# Patient Record
Sex: Male | Born: 1995
Health system: Southern US, Community
[De-identification: ages and names within clinical notes are randomized; demographics above are authoritative.]

## PROBLEM LIST (undated history)

## (undated) DIAGNOSIS — G43909 Migraine, unspecified, not intractable, without status migrainosus: Secondary | ICD-10-CM

## (undated) HISTORY — PX: CLOSED REDUCTION HAND FRACTURE: SHX973

## (undated) HISTORY — DX: Migraine, unspecified, not intractable, without status migrainosus: G43.909

---
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
## 2015-09-17 DIAGNOSIS — J029 Acute pharyngitis, unspecified: Secondary | ICD-10-CM | POA: Diagnosis not present

## 2016-01-02 DIAGNOSIS — M222X2 Patellofemoral disorders, left knee: Secondary | ICD-10-CM | POA: Diagnosis not present

## 2016-01-15 DIAGNOSIS — M25562 Pain in left knee: Secondary | ICD-10-CM | POA: Diagnosis not present

## 2016-01-16 DIAGNOSIS — M25562 Pain in left knee: Secondary | ICD-10-CM | POA: Diagnosis not present

## 2016-01-16 DIAGNOSIS — M7652 Patellar tendinitis, left knee: Secondary | ICD-10-CM | POA: Diagnosis not present

## 2016-01-22 DIAGNOSIS — M25562 Pain in left knee: Secondary | ICD-10-CM | POA: Diagnosis not present

## 2016-01-22 DIAGNOSIS — M7652 Patellar tendinitis, left knee: Secondary | ICD-10-CM | POA: Diagnosis not present

## 2016-01-24 DIAGNOSIS — M25562 Pain in left knee: Secondary | ICD-10-CM | POA: Diagnosis not present

## 2016-01-24 DIAGNOSIS — M7652 Patellar tendinitis, left knee: Secondary | ICD-10-CM | POA: Diagnosis not present

## 2016-01-30 DIAGNOSIS — M25562 Pain in left knee: Secondary | ICD-10-CM | POA: Diagnosis not present

## 2016-01-30 DIAGNOSIS — M7652 Patellar tendinitis, left knee: Secondary | ICD-10-CM | POA: Diagnosis not present

## 2016-02-05 DIAGNOSIS — M25562 Pain in left knee: Secondary | ICD-10-CM | POA: Diagnosis not present

## 2016-02-05 DIAGNOSIS — M7652 Patellar tendinitis, left knee: Secondary | ICD-10-CM | POA: Diagnosis not present

## 2016-02-14 DIAGNOSIS — M7652 Patellar tendinitis, left knee: Secondary | ICD-10-CM | POA: Diagnosis not present

## 2016-02-19 DIAGNOSIS — M7652 Patellar tendinitis, left knee: Secondary | ICD-10-CM | POA: Diagnosis not present

## 2016-02-19 DIAGNOSIS — M25562 Pain in left knee: Secondary | ICD-10-CM | POA: Diagnosis not present

## 2016-06-26 DIAGNOSIS — R202 Paresthesia of skin: Secondary | ICD-10-CM | POA: Diagnosis not present

## 2016-07-01 DIAGNOSIS — R202 Paresthesia of skin: Secondary | ICD-10-CM | POA: Diagnosis not present

## 2016-07-29 DIAGNOSIS — R21 Rash and other nonspecific skin eruption: Secondary | ICD-10-CM | POA: Diagnosis not present

## 2016-07-29 DIAGNOSIS — J309 Allergic rhinitis, unspecified: Secondary | ICD-10-CM | POA: Diagnosis not present

## 2016-07-29 DIAGNOSIS — R202 Paresthesia of skin: Secondary | ICD-10-CM | POA: Diagnosis not present

## 2016-07-29 DIAGNOSIS — H1045 Other chronic allergic conjunctivitis: Secondary | ICD-10-CM | POA: Diagnosis not present

## 2016-08-19 DIAGNOSIS — R202 Paresthesia of skin: Secondary | ICD-10-CM | POA: Diagnosis not present

## 2016-09-10 ENCOUNTER — Encounter: Payer: Self-pay | Admitting: Neurology

## 2016-09-10 ENCOUNTER — Ambulatory Visit (INDEPENDENT_AMBULATORY_CARE_PROVIDER_SITE_OTHER): Payer: BLUE CROSS/BLUE SHIELD | Admitting: Neurology

## 2016-09-10 ENCOUNTER — Encounter (INDEPENDENT_AMBULATORY_CARE_PROVIDER_SITE_OTHER): Payer: Self-pay

## 2016-09-10 VITALS — BP 115/72 | HR 64 | Resp 12 | Wt 173.0 lb

## 2016-09-10 DIAGNOSIS — G35 Multiple sclerosis: Secondary | ICD-10-CM | POA: Diagnosis not present

## 2016-09-10 DIAGNOSIS — R202 Paresthesia of skin: Secondary | ICD-10-CM

## 2016-09-10 NOTE — Patient Instructions (Signed)
Remember to drink plenty of fluid, eat healthy meals and do not skip any meals. Try to eat protein with a every meal and eat a healthy snack such as fruit or nuts in between meals. Try to keep a regular sleep-wake schedule and try to exercise daily, particularly in the form of walking, 20-30 minutes a day, if you can.   As far as diagnostic testing: Labs, MRI brain and cervical spine  Our phone number is [phone]. We also have an after hours call service for urgent matters and there is a physician on-call for urgent questions. For any emergencies you know to call 911 or go to the nearest emergency room

## 2016-09-10 NOTE — Progress Notes (Addendum)
[facility]    Provider:  Dr Lucia Gaskins Referring Provider: Iva Boop, MD Primary Care Physician:  Iva Boop, MD  CC:  Needles stabbing all over the body  HPI:  Travis Middleton is a 21 y.o. male here as a referral from Dr. Via for persistent tingling sensation of skin.. No significant past medical history except migraines. It only happens when he gets hot. Started 4 months ago usually when he gets hot, hot water, in the sun. This occurs all over the body except the hands and feet. Can last a few seconds or until he cools off. It slowly starts and slowly resolves. Hasn't noticed it at night. It does not start in a specific spot. He was at work when he notice, no inciting events, no recent illnesses, no trauma, nothing that can be associated with the onset. The symptoms are painful. He has to stop working to let himself cool down. No vision changes, no weakness, no speech changes or any other associated symptoms. It can come and go all day if he is at work and it is hot. Symptoms are symmetric. Can be an 8/10 in pain, nothing makes it better except cooling down, it is everywhere including face, groin, back, abdomen, limbs, all over the body. No hx of any neurologic issues.rarely he can feel it when he is not hot. No other focal neurologic deficits, associated symptoms, inciting events or modifiable factors.No Fhx of neuropathy or autoimmune disorders. Not progressive. Not exposed to toxins or chemicals.   Reviewed notes, labs and imaging from outside physicians, which showed:   21 year old male presenting for follow-up for persistent tingling sensation over the entire body. Continues to happen when he gets hot. However also happening other times now. Patient's allergy last month. Allergy testing was negative. They recommended continuing loratadine and Singulair. We also added hydroxyzine. Hydroxyzine does not help. In only happens when he gets hot. Claritin has not helped. Symptoms seem to  be worsening. No rash. 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 Reviewed primary care's exam which included normal general appearance, skin, head, eyes, ears, nose, throat, lungs, heart, musculoskeletal, neurologic and site. He was told to continue loratadine tablet once a day and montelukast. Labs ordered such as CBC, CMP, sedimentation rate, TSH, CRP. More consistent with histamine release when he over heats.   Reviewed labs. CRP was normal, TSH was normal, sedimentation rate was normal, CMP was unremarkable with BUN 13 and creatinine 1. CBC was normal.  Review of Systems: Patient complains of symptoms per HPI as well as the following symptoms: Tingling. No rash, no chest pain, no shortness of breath, no fever. Pertinent negatives per HPI. All others negative.   Social History   Social History  . Marital status: Single    Spouse name: N/A  . Number of children: 0  . Years of education: 12   Occupational History  . [employer]    Social History Main Topics  . Smoking status: Never Smoker  . Smokeless tobacco: Never Used  . Alcohol use 2.4 oz/week    4 Standard drinks or equivalent per week  . Drug use: No  . Sexual activity: Not on file   Other Topics Concern  . Not on file   Social History Narrative   Lives at home w/ his mom   Right-handed   Caffeine: occasional soda    Family History  Problem Relation Age of Onset  .  High blood pressure Father   . Cancer Maternal Grandmother   . Other Other        Cancer and heart failure in great-grandparents  . Multiple sclerosis Neg Hx     Past Medical History:  Diagnosis Date  . Migraine     Past Surgical History:  Procedure Laterality Date  . CLOSED REDUCTION HAND FRACTURE Right     Current Outpatient Prescriptions  Medication Sig Dispense Refill  . aspirin-acetaminophen-caffeine (EXCEDRIN MIGRAINE) 250-250-65 MG tablet Take by mouth every 6 (six) hours as needed  for headache.    . loratadine (CLARITIN) 10 MG tablet Take 10 mg by mouth daily.    . montelukast (SINGULAIR) 10 MG tablet      No current facility-administered medications for this visit.     Allergies as of 09/10/2016 - Review Complete 09/10/2016  Allergen Reaction Noted  . Azithromycin  09/10/2016    Vitals: BP 115/72   Pulse 64   Resp 12   Wt 173 lb (78.5 kg)  height 5'7" Last Weight:  Wt Readings from Last 1 Encounters:  09/10/16 173 lb (78.5 kg)   Last Height:   Ht Readings from Last 1 Encounters:  No data found for Ht    Physical exam: Exam: Gen: NAD, conversant        CV: RRR, no MRG. No Carotid Bruits. No peripheral edema, warm, nontender Eyes: Conjunctivae clear without exudates or hemorrhage  Neuro: Detailed Neurologic Exam  Speech:    Speech is normal; fluent and spontaneous with normal comprehension.  Cognition:    The patient is oriented to person, place, and time;     recent and remote memory intact;     language fluent;     normal attention, concentration,     fund of knowledge Cranial Nerves:    The pupils are equal, round, and reactive to light. The fundi are normal and spontaneous venous pulsations are present. Visual fields are full to finger confrontation. Extraocular movements are intact. Trigeminal sensation is intact and the muscles of mastication are normal. The face is symmetric. The palate elevates in the midline. Hearing intact. Voice is normal. Shoulder shrug is normal. The tongue has normal motion without fasciculations.   Coordination:    Normal finger to nose and heel to shin. Normal rapid alternating movements.   Gait:    Heel-toe and tandem gait are normal.   Motor Observation:    No asymmetry, no atrophy, and no involuntary movements noted. Tone:    Normal muscle tone.    Posture:    Posture is normal. normal erect    Strength:    Strength is V/V in the upper and lower limbs.      Sensation: intact to LT and pin prick  in all 4 extremities and torso     Reflex Exam:  DTR's:    Deep tendon reflexes in the upper and lower extremities are normal bilaterally.   Toes:    The toes are downgoing bilaterally.   Clonus:    Clonus is absent.      Assessment/Plan:  This is a 21 year old male who appears to be reliable in his history these had 4 months of paresthesias which are exacerbated by heat. We'll check neuropathy labs today. He denied MRI of the brain and cervical spine to evaluate for demyelinating lesions and MS; multiple sclerosis symptoms worsen with heat neck think this is something we need to rule out in this patient especially since multiple sclerosis can be  aggressive in young African-American males.  Orders Placed This Encounter  Procedures  . MR BRAIN W WO CONTRAST  . MR CERVICAL SPINE W WO CONTRAST  . HIV antibody  . ANA w/Reflex  . Sjogren's syndrome antibods(ssa + ssb)  . B12 and Folate Panel  . Heavy metals, blood  . B. burgdorfi Antibody  . Methylmalonic acid, serum  . RPR   Cc: Dr. Waldron Labs, MD  [facility] [address] [place], [place] [address]  Phone [phone] Fax [phone]

## 2016-09-12 ENCOUNTER — Other Ambulatory Visit: Payer: Self-pay | Admitting: Neurology

## 2016-09-12 DIAGNOSIS — T1590XA Foreign body on external eye, part unspecified, unspecified eye, initial encounter: Secondary | ICD-10-CM

## 2016-09-12 LAB — HEAVY METALS, BLOOD
Arsenic: 6 ug/L (ref 2–23)
Lead, Blood: NOT DETECTED ug/dL (ref 0–19)
MERCURY: NOT DETECTED ug/L (ref 0.0–14.9)

## 2016-09-12 LAB — B12 AND FOLATE PANEL
FOLATE: 10.1 ng/mL (ref >3.0)
Vitamin B-12: 704 pg/mL (ref 232–1245)

## 2016-09-12 LAB — HIV ANTIBODY (ROUTINE TESTING W REFLEX): HIV Screen 4th Generation wRfx: NONREACTIVE

## 2016-09-12 LAB — SJOGREN'S SYNDROME ANTIBODS(SSA + SSB)
ENA SSA (RO) Ab: <0.2 AI (ref 0.0–0.9)
ENA SSB (LA) Ab: <0.2 AI (ref 0.0–0.9)

## 2016-09-12 LAB — B. BURGDORFI ANTIBODIES

## 2016-09-12 LAB — RPR: RPR Ser Ql: NONREACTIVE

## 2016-09-12 LAB — ANA W/REFLEX: ANA: NEGATIVE

## 2016-09-12 LAB — METHYLMALONIC ACID, SERUM: Methylmalonic Acid: 117 nmol/L (ref 0–378)

## 2016-09-17 ENCOUNTER — Telehealth: Payer: Self-pay | Admitting: *Deleted

## 2016-09-17 NOTE — Telephone Encounter (Signed)
Called patient and informed him his labs are normal. He verbalized understanding, appreciation, had no questions.

## 2016-09-25 ENCOUNTER — Ambulatory Visit
Admission: RE | Admit: 2016-09-25 | Discharge: 2016-09-25 | Disposition: A | Discharge status: Home / Self Care | Payer: BLUE CROSS/BLUE SHIELD | Source: Ambulatory Visit | Attending: Neurology | Admitting: Neurology

## 2016-09-25 DIAGNOSIS — R202 Paresthesia of skin: Secondary | ICD-10-CM

## 2016-09-25 DIAGNOSIS — Z01818 Encounter for other preprocedural examination: Secondary | ICD-10-CM | POA: Diagnosis not present

## 2016-09-25 DIAGNOSIS — G35 Multiple sclerosis: Secondary | ICD-10-CM

## 2016-09-25 DIAGNOSIS — T1590XA Foreign body on external eye, part unspecified, unspecified eye, initial encounter: Secondary | ICD-10-CM

## 2016-09-25 MED ORDER — GADOBENATE DIMEGLUMINE 529 MG/ML IV SOLN
15.0000 mL | Freq: Once | INTRAVENOUS | Status: AC | PRN
  Administered 2016-09-25: 15 mL via INTRAVENOUS

## 2016-10-02 ENCOUNTER — Encounter: Payer: Self-pay | Admitting: Neurology

## 2016-12-08 DIAGNOSIS — A084 Viral intestinal infection, unspecified: Secondary | ICD-10-CM | POA: Diagnosis not present

## 2016-12-31 ENCOUNTER — Encounter: Payer: Self-pay | Admitting: Neurology

## 2016-12-31 ENCOUNTER — Ambulatory Visit (INDEPENDENT_AMBULATORY_CARE_PROVIDER_SITE_OTHER): Payer: BLUE CROSS/BLUE SHIELD | Admitting: Neurology

## 2016-12-31 VITALS — BP 131/74 | HR 81 | Wt 172.4 lb

## 2016-12-31 DIAGNOSIS — R202 Paresthesia of skin: Secondary | ICD-10-CM

## 2016-12-31 MED ORDER — GABAPENTIN 300 MG PO CAPS: 300.0000 mg | ORAL_CAPSULE | Freq: Three times a day (TID) | ORAL | 11 refills | Status: DC | PRN

## 2016-12-31 NOTE — Patient Instructions (Signed)
Remember to drink plenty of fluid, eat healthy meals and do not skip any meals. Try to eat protein with a every meal and eat a healthy snack such as fruit or nuts in between meals. Try to keep a regular sleep-wake schedule and try to exercise daily, particularly in the form of walking, 20-30 minutes a day, if you can.   As far as your medications are concerned, I would like to suggest: Gabapentin three times a day as needed  I would like to see you back as needed, sooner if we need to. Please call us with any interim questions, concerns, problems, updates or refill requests.   Our phone number is [phone]. We also have an after hours call service for urgent matters and there is a physician on-call for urgent questions. For any emergencies you know to call 911 or go to the nearest emergency room  Gabapentin capsules or tablets What is this medicine? GABAPENTIN (GA ba pen tin) is used to control partial seizures in adults with epilepsy. It is also used to treat certain types of nerve pain. This medicine may be used for other purposes; ask your health care provider or pharmacist if you have questions. COMMON BRAND NAME(S): Active-PAC with Gabapentin, Gabarone, Neurontin What should I tell my health care provider before I take this medicine? They need to know if you have any of these conditions: -kidney disease -suicidal thoughts, plans, or attempt; a previous suicide attempt by you or a family member -an unusual or allergic reaction to gabapentin, other medicines, foods, dyes, or preservatives -pregnant or trying to get pregnant -breast-feeding How should I use this medicine? Take this medicine by mouth with a glass of water. Follow the directions on the prescription label. You can take it with or without food. If it upsets your stomach, take it with food.Take your medicine at regular intervals. Do not take it more often than directed. Do not stop taking except on your doctor's advice. If you  are directed to break the 600 or 800 mg tablets in half as part of your dose, the extra half tablet should be used for the next dose. If you have not used the extra half tablet within 28 days, it should be thrown away. A special MedGuide will be given to you by the pharmacist with each prescription and refill. Be sure to read this information carefully each time. Talk to your pediatrician regarding the use of this medicine in children. Special care may be needed. Overdosage: If you think you have taken too much of this medicine contact a poison control center or emergency room at once. NOTE: This medicine is only for you. Do not share this medicine with others. What if I miss a dose? If you miss a dose, take it as soon as you can. If it is almost time for your next dose, take only that dose. Do not take double or extra doses. What may interact with this medicine? Do not take this medicine with any of the following medications: -other gabapentin products This medicine may also interact with the following medications: -alcohol -antacids -antihistamines for allergy, cough and cold -certain medicines for anxiety or sleep -certain medicines for depression or psychotic disturbances -homatropine; hydrocodone -naproxen -narcotic medicines (opiates) for pain -phenothiazines like chlorpromazine, mesoridazine, prochlorperazine, thioridazine This list may not describe all possible interactions. Give your health care provider a list of all the medicines, herbs, non-prescription drugs, or dietary supplements you use. Also tell them if you smoke, drink alcohol,  or use illegal drugs. Some items may interact with your medicine. What should I watch for while using this medicine? Visit your doctor or health care professional for regular checks on your progress. You may want to keep a record at home of how you feel your condition is responding to treatment. You may want to share this information with your doctor  or health care professional at each visit. You should contact your doctor or health care professional if your seizures get worse or if you have any new types of seizures. Do not stop taking this medicine or any of your seizure medicines unless instructed by your doctor or health care professional. Stopping your medicine suddenly can increase your seizures or their severity. Wear a medical identification bracelet or chain if you are taking this medicine for seizures, and carry a card that lists all your medications. You may get drowsy, dizzy, or have blurred vision. Do not drive, use machinery, or do anything that needs mental alertness until you know how this medicine affects you. To reduce dizzy or fainting spells, do not sit or stand up quickly, especially if you are an older patient. Alcohol can increase drowsiness and dizziness. Avoid alcoholic drinks. Your mouth may get dry. Chewing sugarless gum or sucking hard candy, and drinking plenty of water will help. The use of this medicine may increase the chance of suicidal thoughts or actions. Pay special attention to how you are responding while on this medicine. Any worsening of mood, or thoughts of suicide or dying should be reported to your health care professional right away. Women who become pregnant while using this medicine may enroll in the [place] Antiepileptic Drug Pregnancy Registry by calling [phone]. This registry collects information about the safety of antiepileptic drug use during pregnancy. What side effects may I notice from receiving this medicine? Side effects that you should report to your doctor or health care professional as soon as possible: -allergic reactions like skin rash, itching or hives, swelling of the face, lips, or tongue -worsening of mood, thoughts or actions of suicide or dying Side effects that usually do not require medical attention (report to your doctor or health care professional if they continue or  are bothersome): -constipation -difficulty walking or controlling muscle movements -dizziness -nausea -slurred speech -tiredness -tremors -weight gain This list may not describe all possible side effects. Call your doctor for medical advice about side effects. You may report side effects to FDA at 1-800-FDA-1088. Where should I keep my medicine? Keep out of reach of children. This medicine may cause accidental overdose and death if it taken by other adults, children, or pets. Mix any unused medicine with a substance like cat litter or coffee grounds. Then throw the medicine away in a sealed container like a sealed bag or a coffee can with a lid. Do not use the medicine after the expiration date. Store at room temperature between 15 and 30 degrees C (59 and 86 degrees F). NOTE: This sheet is a summary. It may not cover all possible information. If you have questions about this medicine, talk to your doctor, pharmacist, or health care provider.  2018 Elsevier/Gold Standard (2013-06-10 15:26:50)

## 2016-12-31 NOTE — Progress Notes (Signed)
[facility]    Provider:  Dr Lucia Gaskins Referring Provider: Iva Boop, MD Primary Care Physician:  Iva Boop, MD  CC:  Needles stabbing all over the body  Interval history 12/31/2016: Patient is here for sensation of tingling all over the body worse in the heat. Neurologic exam when first seen was normal. Workup included normal MRI of the brain, normal MRI of the cervical spine, normal labs including HIV, ANA, Sjogren's, B12, folate and Lyme, methylmalonic acid, RPR.  HPI:  Travis Middleton is a 21 y.o. male here as a referral from Dr. Via for persistent tingling sensation of skin.. No significant past medical history except migraines. It only happens when he gets hot. Started 4 months ago usually when he gets hot, hot water, in the sun. This occurs all over the body except the hands and feet. Can last a few seconds or until he cools off. It slowly starts and slowly resolves. Hasn't noticed it at night. It does not start in a specific spot. He was at work when he notice, no inciting events, no recent illnesses, no trauma, nothing that can be associated with the onset. The symptoms are painful. He has to stop working to let himself cool down. No vision changes, no weakness, no speech changes or any other associated symptoms. It can come and go all day if he is at work and it is hot. Symptoms are symmetric. Can be an 8/10 in pain, nothing makes it better except cooling down, it is everywhere including face, groin, back, abdomen, limbs, all over the body. No hx of any neurologic issues.rarely he can feel it when he is not hot. No other focal neurologic deficits, associated symptoms, inciting events or modifiable factors.No Fhx of neuropathy or autoimmune disorders. Not progressive. Not exposed to toxins or chemicals.   Reviewed notes, labs and imaging from outside physicians, which showed:   21 year old male presenting for follow-up for persistent tingling sensation over the entire body.  Continues to happen when he gets hot. However also happening other times now. Patient's allergy last month. Allergy testing was negative. They recommended continuing loratadine and Singulair. We also added hydroxyzine. Hydroxyzine does not help. In only happens when he gets hot. Claritin has not helped. Symptoms seem to be worsening. No rash. 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 Reviewed primary care's exam which included normal general appearance, skin, head, eyes, ears, nose, throat, lungs, heart, musculoskeletal, neurologic and site. He was told to continue loratadine tablet once a day and montelukast. Labs ordered such as CBC, CMP, sedimentation rate, TSH, CRP. More consistent with histamine release when he over heats.   Reviewed labs. CRP was normal, TSH was normal, sedimentation rate was normal, CMP was unremarkable with BUN 13 and creatinine 1. CBC was normal.  Review of Systems: Patient complains of symptoms per HPI as well as the following symptoms: Tingling. No rash, no chest pain, no shortness of breath, no fever. Pertinent negatives per HPI. All others negative.     Social History   Social History  . Marital status: Single    Spouse name: N/A  . Number of children: 0  . Years of education: 12   Occupational History  . [employer]    Social History Main Topics  . Smoking status: Never Smoker  . Smokeless tobacco: Never Used  . Alcohol use 3.6 oz/week    4 Standard drinks or equivalent, 1 Shots of liquor,  1 Glasses of wine per week     Comment: social events  . Drug use: No  . Sexual activity: Not on file   Other Topics Concern  . Not on file   Social History Narrative   Lives at home w/ his mom   Right-handed   Caffeine: occasional soda    Family History  Problem Relation Age of Onset  . High blood pressure Father   . Cancer Maternal Grandmother   . Other Other        Cancer and heart failure in  great-grandparents  . Multiple sclerosis Neg Hx     Past Medical History:  Diagnosis Date  . Migraine     Past Surgical History:  Procedure Laterality Date  . CLOSED REDUCTION HAND FRACTURE Right     Current Outpatient Prescriptions  Medication Sig Dispense Refill  . aspirin-acetaminophen-caffeine (EXCEDRIN MIGRAINE) 250-250-65 MG tablet Take by mouth every 6 (six) hours as needed for headache.     No current facility-administered medications for this visit.     Allergies as of 12/31/2016 - Review Complete 12/31/2016  Allergen Reaction Noted  . Azithromycin  09/10/2016    Vitals: BP 131/74 (BP Location: Right Arm, Patient Position: Sitting, Cuff Size: Small)   Pulse 81   Wt 172 lb 6.4 oz (78.2 kg)  Last Weight:  Wt Readings from Last 1 Encounters:  12/31/16 172 lb 6.4 oz (78.2 kg)   Last Height:   Ht Readings from Last 1 Encounters:  No data found for Ht   Physical exam: Exam: Gen: NAD, conversant, well nourised, obese, well groomed                     CV: RRR, no MRG. No Carotid Bruits. No peripheral edema, warm, nontender Eyes: Conjunctivae clear without exudates or hemorrhage  Neuro: Detailed Neurologic Exam  Speech:    Speech is normal; fluent and spontaneous with normal comprehension.  Cognition:    The patient is oriented to person, place, and time;     recent and remote memory intact;     language fluent;     normal attention, concentration,     fund of knowledge Cranial Nerves:    The pupils are equal, round, and reactive to light. The fundi are normal and spontaneous venous pulsations are present. Visual fields are full to finger confrontation. Extraocular movements are intact. Trigeminal sensation is intact and the muscles of mastication are normal. The face is symmetric. The palate elevates in the midline. Hearing intact. Voice is normal. Shoulder shrug is normal. The tongue has normal motion without fasciculations.   Coordination:    Normal  finger to nose and heel to shin. Normal rapid alternating movements.   Gait:    Heel-toe and tandem gait are normal.   Motor Observation:    No asymmetry, no atrophy, and no involuntary movements noted. Tone:    Normal muscle tone.    Posture:    Posture is normal. normal erect    Strength:    Strength is V/V in the upper and lower limbs.      Sensation: intact to LT     Reflex Exam:  DTR's:    Deep tendon reflexes in the upper and lower extremities are normal bilaterally.   Toes:    The toes are downgoing bilaterally.   Clonus:    Clonus is absent.     Assessment/Plan:  This is a 21 year old male who appears to be reliable in  his history these had 4 months of paresthesias which are exacerbated by heat. We'll check neuropathy labs today. He denied MRI of the brain and cervical spine to evaluate for demyelinating lesions and MS; multiple sclerosis symptoms worsen with heat neck think this is something we need to rule out in this patient especially since multiple sclerosis can be aggressive in young African-American males.   MRI brain/cervical spine normal Labs all normal Will try Gabapentin and patient is to email me to give me an update in 4 weeks. Also RTC or email with any worening or new symptoms.  Cc:Dr. [id number]  Naomie Dean, MD  [facility] [address] [place], [place] [address]  Phone [phone] Fax [phone]  A total of 15 minutes was spent face-to-face with this patient. Over half this time was spent on counseling patient on the paresthesias diagnosis and different diagnostic and therapeutic options available.

## 2017-01-07 DIAGNOSIS — M778 Other enthesopathies, not elsewhere classified: Secondary | ICD-10-CM | POA: Diagnosis not present

## 2017-01-07 DIAGNOSIS — M79641 Pain in right hand: Secondary | ICD-10-CM | POA: Diagnosis not present

## 2017-01-07 DIAGNOSIS — M25541 Pain in joints of right hand: Secondary | ICD-10-CM | POA: Diagnosis not present

## 2017-02-24 DIAGNOSIS — Z881 Allergy status to other antibiotic agents status: Secondary | ICD-10-CM | POA: Diagnosis not present

## 2017-02-24 DIAGNOSIS — X58XXXD Exposure to other specified factors, subsequent encounter: Secondary | ICD-10-CM | POA: Diagnosis not present

## 2017-02-24 DIAGNOSIS — S62336D Displaced fracture of neck of fifth metacarpal bone, right hand, subsequent encounter for fracture with routine healing: Secondary | ICD-10-CM | POA: Diagnosis not present

## 2017-02-24 DIAGNOSIS — G8918 Other acute postprocedural pain: Secondary | ICD-10-CM | POA: Diagnosis not present

## 2017-02-24 DIAGNOSIS — M79641 Pain in right hand: Secondary | ICD-10-CM | POA: Diagnosis not present

## 2017-02-24 DIAGNOSIS — S62336A Displaced fracture of neck of fifth metacarpal bone, right hand, initial encounter for closed fracture: Secondary | ICD-10-CM | POA: Diagnosis not present

## 2017-04-08 DIAGNOSIS — M79641 Pain in right hand: Secondary | ICD-10-CM | POA: Diagnosis not present

## 2017-04-08 DIAGNOSIS — Y33XXXD Other specified events, undetermined intent, subsequent encounter: Secondary | ICD-10-CM | POA: Diagnosis not present

## 2017-04-08 DIAGNOSIS — S62336D Displaced fracture of neck of fifth metacarpal bone, right hand, subsequent encounter for fracture with routine healing: Secondary | ICD-10-CM | POA: Diagnosis not present

## 2017-06-10 DIAGNOSIS — S62336D Displaced fracture of neck of fifth metacarpal bone, right hand, subsequent encounter for fracture with routine healing: Secondary | ICD-10-CM | POA: Diagnosis not present

## 2017-06-10 DIAGNOSIS — Y33XXXD Other specified events, undetermined intent, subsequent encounter: Secondary | ICD-10-CM | POA: Diagnosis not present

## 2017-06-10 DIAGNOSIS — M79641 Pain in right hand: Secondary | ICD-10-CM | POA: Diagnosis not present

## 2018-01-15 IMAGING — CR DG ORBITS FOR FOREIGN BODY
2 series · 2 of 2 positions shown · non-contrast
Comparison: None.

CLINICAL DATA: Metal working/exposure; clearance prior to MRI

EXAM:
ORBITS FOR FOREIGN BODY - 2 VIEW

[w orbit pa (1 of 2)]
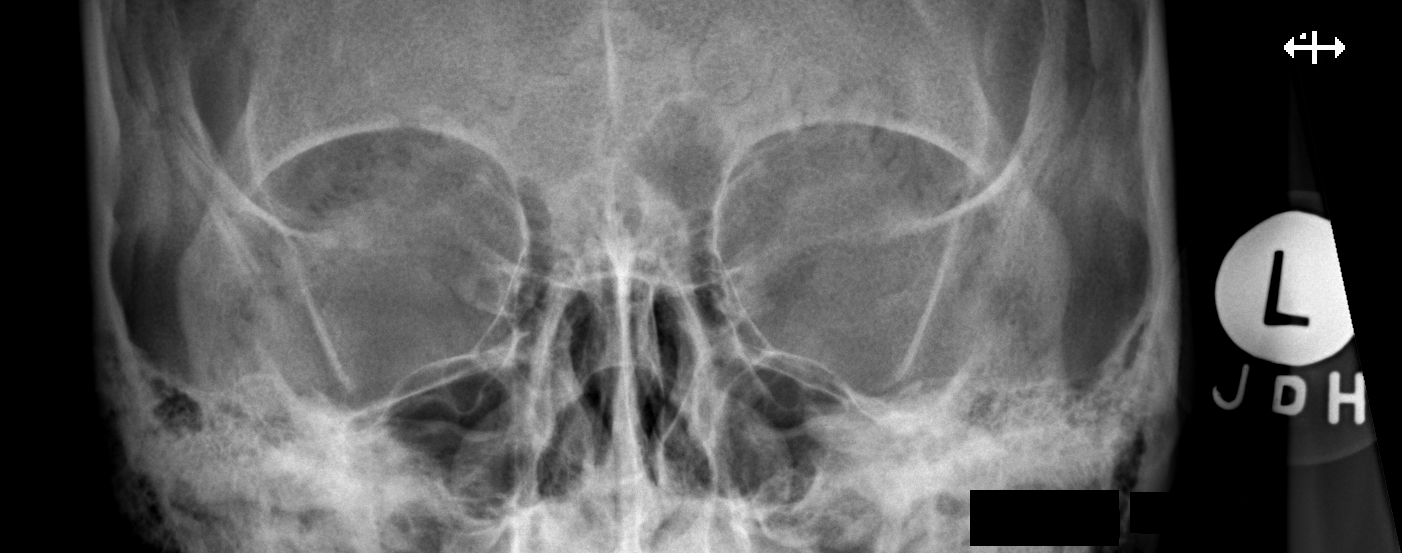

[w orbit pa (2 of 2)]
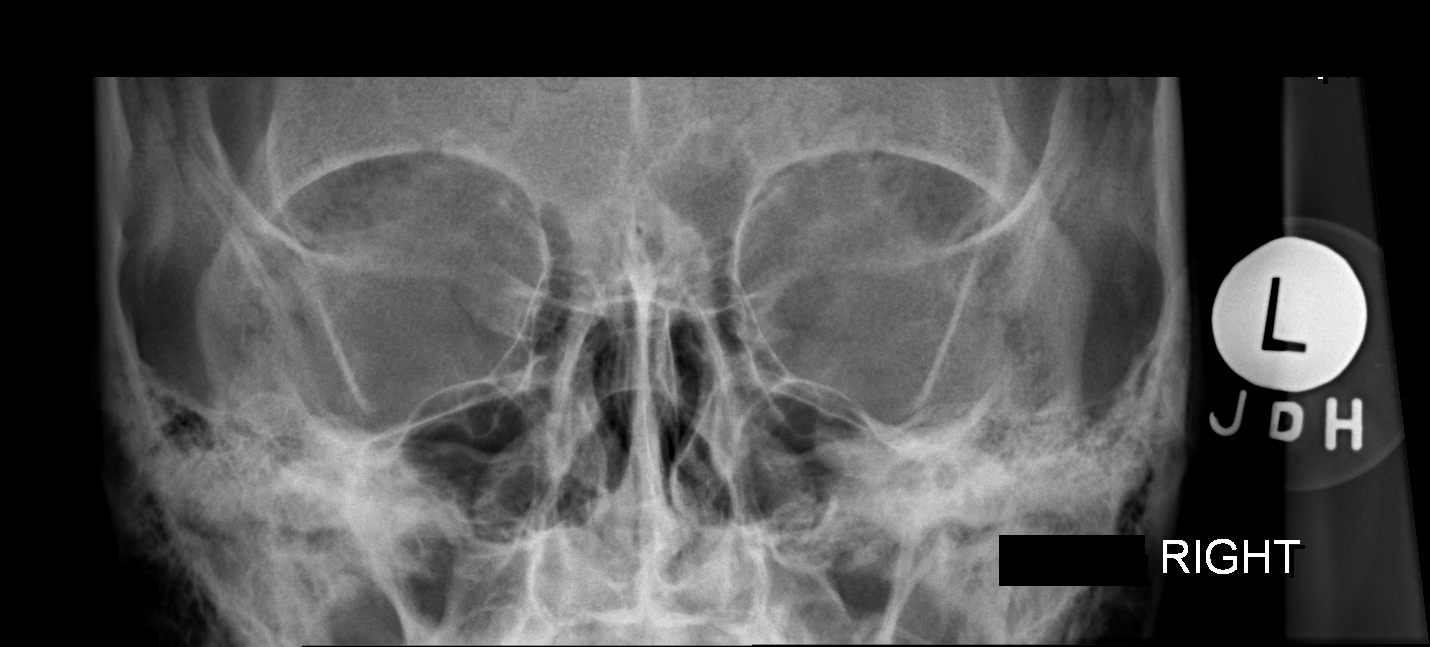

[2 of 2 positions shown; findings below may reference images not displayed]

FINDINGS: There is no evidence of metallic foreign body within the orbits. No
significant bone abnormality identified.
IMPRESSION: No evidence of metallic foreign body within the orbits.

## 2018-03-10 DIAGNOSIS — G44209 Tension-type headache, unspecified, not intractable: Secondary | ICD-10-CM | POA: Diagnosis not present

## 2018-03-29 DIAGNOSIS — G43009 Migraine without aura, not intractable, without status migrainosus: Secondary | ICD-10-CM | POA: Diagnosis not present

## 2018-05-03 DIAGNOSIS — G43901 Migraine, unspecified, not intractable, with status migrainosus: Secondary | ICD-10-CM | POA: Diagnosis not present

## 2018-08-02 DIAGNOSIS — G43901 Migraine, unspecified, not intractable, with status migrainosus: Secondary | ICD-10-CM | POA: Diagnosis not present

## 2018-10-19 ENCOUNTER — Ambulatory Visit (INDEPENDENT_AMBULATORY_CARE_PROVIDER_SITE_OTHER): Payer: BC Managed Care – PPO | Admitting: Family Medicine

## 2018-10-19 ENCOUNTER — Encounter: Payer: Self-pay | Admitting: Family Medicine

## 2018-10-19 ENCOUNTER — Other Ambulatory Visit: Payer: Self-pay

## 2018-10-19 VITALS — BP 122/86 | HR 83 | Temp 98.1°F | Resp 16 | Ht 73.0 in | Wt 160.2 lb

## 2018-10-19 DIAGNOSIS — Z0000 Encounter for general adult medical examination without abnormal findings: Secondary | ICD-10-CM | POA: Diagnosis not present

## 2018-10-19 DIAGNOSIS — F17200 Nicotine dependence, unspecified, uncomplicated: Secondary | ICD-10-CM | POA: Diagnosis not present

## 2018-10-19 DIAGNOSIS — R4586 Emotional lability: Secondary | ICD-10-CM | POA: Diagnosis not present

## 2018-10-19 DIAGNOSIS — R454 Irritability and anger: Secondary | ICD-10-CM | POA: Diagnosis not present

## 2018-10-19 LAB — LIPID PANEL
Cholesterol: 148 mg/dL (ref 0–200)
HDL: 41.7 mg/dL (ref >39.00)
LDL Cholesterol: 99 mg/dL (ref 0–99)
NonHDL: 106.75
Total CHOL/HDL Ratio: 4
Triglycerides: 38 mg/dL (ref 0.0–149.0)
VLDL: 7.6 mg/dL (ref 0.0–40.0)

## 2018-10-19 LAB — COMPREHENSIVE METABOLIC PANEL
ALT: 14 U/L (ref 0–53)
AST: 12 U/L (ref 0–37)
Albumin: 4.8 g/dL (ref 3.5–5.2)
Alkaline Phosphatase: 58 U/L (ref 39–117)
BUN: 9 mg/dL (ref 6–23)
CO2: 29 mEq/L (ref 19–32)
Calcium: 9.6 mg/dL (ref 8.4–10.5)
Chloride: 103 mEq/L (ref 96–112)
Creatinine, Ser: 0.83 mg/dL (ref 0.40–1.50)
GFR: 138.53 mL/min (ref >60.00)
Glucose, Bld: 83 mg/dL (ref 70–99)
Potassium: 4.1 mEq/L (ref 3.5–5.1)
Sodium: 140 mEq/L (ref 135–145)
Total Bilirubin: 0.7 mg/dL (ref 0.2–1.2)
Total Protein: 6.8 g/dL (ref 6.0–8.3)

## 2018-10-19 LAB — CBC WITH DIFFERENTIAL/PLATELET
Basophils Absolute: 0 10*3/uL (ref 0.0–0.1)
Basophils Relative: 0.7 % (ref 0.0–3.0)
Eosinophils Absolute: 0 10*3/uL (ref 0.0–0.7)
Eosinophils Relative: 0.3 % (ref 0.0–5.0)
HCT: 43.9 % (ref 39.0–52.0)
Hemoglobin: 14.2 g/dL (ref 13.0–17.0)
Lymphocytes Relative: 26.5 % (ref 12.0–46.0)
Lymphs Abs: 1.6 10*3/uL (ref 0.7–4.0)
MCHC: 32.4 g/dL (ref 30.0–36.0)
MCV: 86.4 fl (ref 78.0–100.0)
Monocytes Absolute: 0.4 10*3/uL (ref 0.1–1.0)
Monocytes Relative: 7 % (ref 3.0–12.0)
Neutro Abs: 3.9 10*3/uL (ref 1.4–7.7)
Neutrophils Relative %: 65.5 % (ref 43.0–77.0)
Platelets: 270 10*3/uL (ref 150.0–400.0)
RBC: 5.09 Mil/uL (ref 4.22–5.81)
RDW: 13.4 % (ref 11.5–15.5)
WBC: 5.9 10*3/uL (ref 4.0–10.5)

## 2018-10-19 LAB — TSH: TSH: 1.49 u[IU]/mL (ref 0.35–4.50)

## 2018-10-19 NOTE — Progress Notes (Signed)
Subjective  Chief Complaint  Patient presents with  . Establish Care    Dr. Susa Simmonds at [facility], last CPE was 2019  . Annual Exam    Not fasting   HPI: Travis Middleton is a 23 y.o. male who presents to [facility] today for a Male Wellness Visit.   Wellness Visit: annual visit with health maintenance review and exam    23 yo male seeing neurology at [facility] for migraines and recently started on nortriptyline and triptan doing well. (normal brain MRI) I used to see his mom who now lives back in [place].   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 Single. Sexually active. No h/o STDs. Declines screens today.   C/o problems with mood: almost lost his job due to absenteeism - low motivation, feeling tired, unhappy; as well, has a problem with anger management. Has struggled with relationships for years, family, friends and romantic. Denies panic or PTSD. Has never sought help; no h/o therapy. Now wanting help.   Reviewed imm hx: will be due for Tdap.  No flowsheet data found.   No flowsheet data found.    Lifestyle: Body mass index is 21.14 kg/m. Wt Readings from Last 3 Encounters:  10/19/18 160 lb 3.2 oz (72.7 kg)  12/31/16 172 lb 6.4 oz (78.2 kg)  09/10/16 173 lb (78.5 kg)     Patient Active Problem List   Diagnosis Date Noted  . Outbursts of anger 10/19/2018  . Mood and affect disturbance 10/19/2018  . Chronic headaches 10/04/2013  . Migraine 10/04/2013   Health Maintenance  Topic Date Due  . TETANUS/TDAP  12/10/2016  . INFLUENZA VACCINE  11/27/2018  . HIV Screening  Completed   Immunization History  Administered Date(s) Administered  . Hpv 02/16/2009, 04/18/2009, 08/28/2009  . Tdap 12/11/2006   We updated and reviewed the patient's past history in detail and it is documented below. Allergies: Patient is allergic to azithromycin. Past Medical History  has a past medical history of Migraine. Past Surgical History  has a past  surgical history that includes Closed reduction hand fracture (Right). Social History Patient  reports that he has been smoking. He has never used smokeless tobacco. He reports current alcohol use of about 6.0 standard drinks of alcohol per week. He reports that he does not use drugs. Family History Patient family history includes Cancer in his maternal grandmother; High blood pressure in his father; Other in an other family member. Review of Systems: Constitutional: negative for fever or malaise Ophthalmic: negative for photophobia, double vision or loss of vision Cardiovascular: negative for chest pain, dyspnea on exertion, or new LE swelling Respiratory: negative for SOB or persistent cough Gastrointestinal: negative for abdominal pain, change in bowel habits or melena Genitourinary: negative for dysuria or gross hematuria Musculoskeletal: negative for new gait disturbance or muscular weakness Integumentary: negative for new or persistent rashes, no breast lumps Neurological: negative for TIA or stroke symptoms Psychiatric: negative for SI or delusions Allergic/Immunologic: negative for hives  Patient Care Team    Relationship Specialty Notifications Start End  Willow Ora, MD PCP - General Family Medicine  10/19/18    Objective  Vitals: BP 122/86   Pulse 83   Temp 98.1 F (36.7 C) (Oral)   Resp 16   Ht 6\' 1"  (1.854 m)   Wt 160 lb 3.2 oz (72.7 kg)   SpO2 96%   BMI 21.14 kg/m  General:  Well developed, well nourished, no  acute distress  Psych:  Alert and orientedx3,normal mood and affect HEENT:  Normocephalic, atraumatic, non-icteric sclera, PERRL, oropharynx is clear without mass or exudate, supple neck without adenopathy, mass or thyromegaly Cardiovascular:  Normal S1, S2, RRR without gallop, rub or murmur, nondisplaced PMI, +2 distal pulses in bilateral upper and lower extremities. Respiratory:  Good breath sounds bilaterally, CTAB with normal respiratory effort  Gastrointestinal: normal bowel sounds, soft, non-tender, no noted masses. No HSM MSK: no deformities, contusions. Joints are without erythema or swelling. Spine and CVA region are nontender Skin:  Warm, no rashes or suspicious lesions noted Neurologic:    Mental status is normal. CN 2-11 are normal. Gross motor and sensory exams are normal. Stable gait. No tremor GU: No inguinal hernias or adenopathy are appreciated bilaterally  Assessment  1. Annual physical exam   2. Mood and affect disturbance   3. Tobacco use disorder, mild, abuse   4. Outbursts of anger      Plan  Male Wellness Visit:  Age appropriate Health Maintenance and Prevention measures were discussed with patient. Included topics are cancer screening recommendations, ways to keep healthy (see AVS) including dietary and exercise recommendations, regular eye and dental care, use of seat belts, and avoidance of moderate alcohol use and tobacco use.   BMI: discussed patient's BMI and encouraged positive lifestyle modifications to help get to or maintain a target BMI.  HM needs and immunizations were addressed and ordered. See below for orders. See HM and immunization section for updates.  Routine labs and screening tests ordered including cmp, cbc and lipids where appropriate.  Discussed recommendations regarding Vit D and calcium supplementation (see AVS)  Mood d/o: counseling started. rec therapy to start, close f/u and likely will discuss starting an SSRI.   Follow up: Return in about 4 weeks (around 11/16/2018) for mood follow up.   Commons side effects, risks, benefits, and alternatives for medications and treatment plan prescribed today were discussed, and the patient expressed understanding of the given instructions. Patient is instructed to call or message via MyChart if he/she has any questions or concerns regarding our treatment plan. No barriers to understanding were identified. We discussed Red Flag symptoms and  signs in detail. Patient expressed understanding regarding what to do in case of urgent or emergency type symptoms.   Medication list was reconciled, printed and provided to the patient in AVS. Patient instructions and summary information was reviewed with the patient as documented in the AVS. This note was prepared with assistance of Dragon voice recognition software. Occasional wrong-word or sound-a-like substitutions may have occurred due to the inherent limitations of voice recognition software  Orders Placed This Encounter  Procedures  . CBC with Differential/Platelet  . Comprehensive metabolic panel  . Lipid panel  . HIV Antibody (routine testing w rflx)  . TSH   No orders of the defined types were placed in this encounter.

## 2018-10-19 NOTE — Patient Instructions (Signed)
Please return in 4 weeks to further assess your mood.   Please call [facility] to schedule an appointment with Dr. Trey Paula; she is a therapist here at our [place] office.  The phone number is: [phone]   It was a pleasure meeting you today! Thank you for choosing [place] to meet your healthcare needs! I truly look forward to working with you. If you have any questions or concerns, please send me a message via Mychart or call the office at [phone].   Tips for Managing Your Anger How can anger affect me? Everyone feels angry from time to time. It is okay and normal to feel angry. However, the way that you behave or react to anger can make it a problem. If you react too strongly to anger or you cannot control your anger, that can cause relationships problems at home and work. Anger can also affect your health. Uncontrolled anger increases your risk of heart disease. When you are angry, your heart rate and blood pressure rise. Levels of certain energy hormones, such as adrenaline, also increase. When this happens, your heart has to work harder. In extreme cases, anger can cause the blood vessels to become narrow. This reduces the supply of blood and oxygen to the heart, and that can trigger chest pain (angina). Anger can also trigger stress-related problems, such as:  Headaches.  Poor digestion.  Trouble sleeping (insomnia). What actions can be taken?     You can take actions to help you manage your anger. For example:  Express your anger. When you express your anger in a healthy way, it is a form of communication. The following strategies can help you to express your anger in a healthy way and when you are ready to do so: ? Step away. When you are feeling reactive, it may take at least 20 minutes for your body to return to its normal blood pressure and heart rate. To help your body do this, take a walk, listen to music, stretch, take deep breaths, and  avoid the situation or person who is making you angry. Try to only discuss your anger when you feel calm again. ? Try to consider how others feel before you react. Avoid swearing, sighing, raising your voice, or blaming. ? Choose a good time to work through problems. You may be more likely to lose your temper at the end of the day when you are tired. ? Keep an anger journal. Writing down the situations that make you angry can help you figure out what triggers your anger and why.  Consider changing your perception. Is there another way you can view the situation that will leave you with a different emotion? Sometimes, changing the way you think about a situation can make it seem less infuriating. Here are some ways to do that: ? Remind yourself that everyone is not out to get you. ? Remind yourself that a disappointing result is not the end of the world. ? Take steps to solve or prevent the situation that upsets you. ? Find the humor in an aggravating situation. ? Deal with the physical effects by taking deep breaths, exercising, or taking a walk. ? Slowly repeat the word "relax" or another calming phrase. ? Picture a relaxing image in your mind. Close your eyes and use that image to help you calm yourself. Why are these changes important? Anger becomes a problem if it occurs frequently and lasts for long periods of time. You may  also need help managing your anger if:  You use physical force or aggression when you are angry and others feel threatened and fearful.  You feel that your anger is out of control.  Anger is interfering with your job.  Anger is causing problems with your health.  Anger is causing problems with your relationships.  Anger is affecting your ability to tolerate normal daily situations, such as sitting in a traffic jam or waiting in line.  You treat others disrespectfully.  You do not trust people around you. It may help to ask someone you trust whether he or she  thinks you show any of these signs. Sometimes, it can be hard to recognize the problem yourself. Where to find support  A psychologist or another licensed mental health professional can help you learn how to manage your anger. Ask your health care provider for a referral, or look online to find a psychologist who specializes in anger management. You can search the websites of many mental health organizations to find a mental health care provider. Local Domestic Abuse Projects are also available for help. Your local hospital or behavioral counselors in your area may also offer anger management programs or support groups that can help. Where to find more information  The U.S. Centers for Disease Control and Prevention: VisualTrips.huwww.cdc.gov/bam/life/getting-along3.html  American Psychological Association: https://www.jennings-kim.com/www.apa.org/topics/anger/  The Substance Abuse and Mental Health Services Administration: EpicRoom.plhttp://www.samhsa.gov/samhsaNewsLetter/Volume_22_Number_3/working_with_anger/  [employer] [facility] on Domestic Violence: ChurchReunion.co.ukhttp://www.nrcdv.org/dvam/ This information is not intended to replace advice given to you by your health care provider. Make sure you discuss any questions you have with your health care provider. Document Released: 02/09/2007 Document Revised: 08/14/2016 Document Reviewed: 02/16/2015 Elsevier Interactive Patient Education  2019 [employer]

## 2018-10-20 LAB — HIV ANTIBODY (ROUTINE TESTING W REFLEX): HIV 1&2 Ab, 4th Generation: NONREACTIVE

## 2018-11-18 ENCOUNTER — Other Ambulatory Visit: Payer: Self-pay

## 2018-11-18 ENCOUNTER — Encounter: Payer: Self-pay | Admitting: Family Medicine

## 2018-11-18 ENCOUNTER — Ambulatory Visit (INDEPENDENT_AMBULATORY_CARE_PROVIDER_SITE_OTHER): Payer: BC Managed Care – PPO | Admitting: Family Medicine

## 2018-11-18 VITALS — BP 140/72 | HR 89 | Temp 98.5°F | Resp 16 | Ht 73.0 in | Wt 160.8 lb

## 2018-11-18 DIAGNOSIS — F339 Major depressive disorder, recurrent, unspecified: Secondary | ICD-10-CM

## 2018-11-18 MED ORDER — FLUOXETINE HCL 20 MG PO TABS: ORAL_TABLET | ORAL | 2 refills | Status: DC

## 2018-11-18 NOTE — Progress Notes (Signed)
Subjective  CC:  Chief Complaint  Patient presents with  . Mood    He reports no change    HPI: Travis Middleton is a 23 y.o. male who presents to the office today to address the problems listed above in the chief complaint, mood problems.  No change. See last note. Since then, he did not follow up with making an appt with a therapist. Continues with hypoactive low mood sxs. Reports had similar sxs as a child but never was on meds. He denies current suicidal or homicidal plan or intent.  Depression screen PHQ 2/9 10/19/2018  Decreased Interest 1  Down, Depressed, Hopeless 1  PHQ - 2 Score 2  Altered sleeping 1  Tired, decreased energy 2  Change in appetite 2  Feeling bad or failure about yourself  0  Trouble concentrating 2  Moving slowly or fidgety/restless 0  Suicidal thoughts 0  PHQ-9 Score 9  Difficult doing work/chores Very difficult    Previously on prescription medications for mood/anxiety: never but it was recommended as a child, mom declined Therapist/counseling:none Previous Diagnosis of psychiatric disorder: ? Depression/anger problems Family history of psychiatric disorder: na  Assessment  1. Major depression, recurrent, chronic (HCC)      Plan   Depression: Counseling done.  Recommend starting SSRI.  Prozac titration.  Recheck 6 to 8 weeks.  Education on expectations.  Once mood is improved, will recommend psychotherapy.  Patient stands and agrees with care plan.  Reviewed concept of mood problems caused by biochemical imbalance of neurotransmitters and rationale for treatment with medications and therapy.   Counseling given: pt was instructed to contact office, on-call physician or crisis Hotline if symptoms worsen significantly. If patient develops any suicidal or homicidal thoughts, he is directed to the ER immediately.   Follow up: Return in about 8 weeks (around 01/13/2019) for mood follow up.  No orders of the defined types were placed in this encounter.  Meds ordered this encounter  Medications  . FLUoxetine (PROZAC) 20 MG tablet    Sig: Take 0.5 tablets (10 mg total) by mouth daily for 7 days, THEN 1 tablet (20 mg total) daily.    Dispense:  30 tablet    Refill:  2      I reviewed the patients updated PMH, FH, and SocHx.    Patient Active Problem List   Diagnosis Date Noted  . Major depression, recurrent, chronic (HCC) 11/18/2018  . Outbursts of anger 10/19/2018  . Mood and affect disturbance 10/19/2018  . Chronic headaches 10/04/2013  . Migraine 10/04/2013   Current Meds  Medication Sig  . nortriptyline (PAMELOR) 25 MG capsule Take by mouth.  . rizatriptan (MAXALT) 5 MG tablet     Allergies: Patient is allergic to azithromycin. Family history:  Patient family history includes Cancer in his maternal grandmother; High blood pressure in his father; Other in an other family member. Social History   Socioeconomic History  . Marital status: Single    Spouse name: Not on file  . Number of children: 0  . Years of education: 3  . Highest education level: Not on file  Occupational History  . Occupation: [employer]  Social Needs  . Financial resource strain: Not on file  . Food insecurity    Worry: Not on file    Inability: Not on file  . Transportation needs    Medical: Not on file    Non-medical: Not on file  Tobacco Use  . Smoking status: Current  Every Day Smoker  . Smokeless tobacco: Never Used  Substance and Sexual Activity  . Alcohol use: Yes    Alcohol/week: 6.0 standard drinks    Types: 1 Glasses of wine, 1 Shots of liquor, 4 Standard drinks or equivalent per week    Comment: social events  . Drug use: No  . Sexual activity: Not on file  Lifestyle  . Physical activity    Days per week: Not on file    Minutes per session: Not on file  . Stress: Not on file  Relationships  . Social Herbalist on phone: Not on file    Gets together: Not on file    Attends religious service: Not on file     Active member of club or organization: Not on file    Attends meetings of clubs or organizations: Not on file    Relationship status: Not on file  Other Topics Concern  . Not on file  Social History Narrative   Lives at home w/ his mom   Right-handed   Caffeine: occasional soda     Review of Systems: Constitutional: Negative for fever malaise or anorexia Cardiovascular: negative for chest pain Respiratory: negative for SOB or persistent cough Gastrointestinal: negative for abdominal pain  Objective  Vitals: BP 140/72   Pulse 89   Temp 98.5 F (36.9 C) (Oral)   Resp 16   Ht 6\' 1"  (1.854 m)   Wt 160 lb 12.8 oz (72.9 kg)   SpO2 98%   BMI 21.22 kg/m  General: no acute distress, well appearing, no apparent distress, well groomed Psych:  Alert and oriented x 3, flat affect, hypokinetic, normal speech.   Commons side effects, risks, benefits, and alternatives for medications and treatment plan prescribed today were discussed, and the patient expressed understanding of the given instructions. Patient is instructed to call or message via MyChart if he/she has any questions or concerns regarding our treatment plan. No barriers to understanding were identified. We discussed Red Flag symptoms and signs in detail. Patient expressed understanding regarding what to do in case of urgent or emergency type symptoms.   Medication list was reconciled, printed and provided to the patient in AVS. Patient instructions and summary information was reviewed with the patient as documented in the AVS. This note was prepared with assistance of Dragon voice recognition software. Occasional wrong-word or sound-a-like substitutions may have occurred due to the inherent limitations of voice recognition software

## 2018-11-18 NOTE — Patient Instructions (Signed)
Please return in 6-8 weeks to recheck mood.  Send me a mychart note if you are having any problems.   If you have any questions or concerns, please don't hesitate to send me a message via MyChart or call the office at [phone]. Thank you for visiting with [place] today! It's our pleasure caring for you.   Major Depressive Disorder, Adult Major depressive disorder (MDD) is a mental health condition. It may also be called clinical depression or unipolar depression. MDD usually causes feelings of sadness, hopelessness, or helplessness. MDD can also cause physical symptoms. It can interfere with work, school, relationships, and other everyday activities. MDD may be mild, moderate, or severe. It may occur once (single episode major depressive disorder) or it may occur multiple times (recurrent major depressive disorder). What are the causes? The exact cause of this condition is not known. MDD is most likely caused by a combination of things, which may include:  Genetic factors. These are traits that are passed along from parent to child.  Individual factors. Your personality, your behavior, and the way you handle your thoughts and feelings may contribute to MDD. This includes personality traits and behaviors learned from others.  Physical factors, such as: ? Differences in the part of your brain that controls emotion. This part of your brain may be different than it is in people who do not have MDD. ? Long-term (chronic) medical or psychiatric illnesses.  Social factors. Traumatic experiences or major life changes may play a role in the development of MDD. What increases the risk? This condition is more likely to develop in women. The following factors may also make you more likely to develop MDD:  A family history of depression.  Troubled family relationships.  Abnormally low levels of certain brain chemicals.  Traumatic events in childhood, especially abuse or the loss of a parent.  Being  under a lot of stress, or long-term stress, especially from upsetting life experiences or losses.  A history of: ? Chronic physical illness. ? Other mental health disorders. ? Substance abuse.  Poor living conditions.  Experiencing social exclusion or discrimination on a regular basis. What are the signs or symptoms? The main symptoms of MDD typically include:  Constant depressed or irritable mood.  Loss of interest in things and activities. MDD symptoms may also include:  Sleeping or eating too much or too little.  Unexplained weight change.  Fatigue or low energy.  Feelings of worthlessness or guilt.  Difficulty thinking clearly or making decisions.  Thoughts of suicide or of harming others.  Physical agitation or weakness.  Isolation. Severe cases of MDD may also occur with other symptoms, such as:  Delusions or hallucinations, in which you imagine things that are not real (psychotic depression).  Low-level depression that lasts at least a year (chronic depression or persistent depressive disorder).  Extreme sadness and hopelessness (melancholic depression).  Trouble speaking and moving (catatonic depression). How is this diagnosed? This condition may be diagnosed based on:  Your symptoms.  Your medical history, including your mental health history. This may involve tests to evaluate your mental health. You may be asked questions about your lifestyle, including any drug and alcohol use, and how long you have had symptoms of MDD.  A physical exam.  Blood tests to rule out other conditions. You must have a depressed mood and at least four other MDD symptoms most of the day, nearly every day in the same 2-week timeframe before your health care provider can confirm  a diagnosis of MDD. How is this treated? This condition is usually treated by mental health professionals, such as psychologists, psychiatrists, and clinical social workers. You may need more than one  type of treatment. Treatment may include:  Psychotherapy. This is also called talk therapy or counseling. Types of psychotherapy include: ? Cognitive behavioral therapy (CBT). This type of therapy teaches you to recognize unhealthy feelings, thoughts, and behaviors, and replace them with positive thoughts and actions. ? Interpersonal therapy (IPT). This helps you to improve the way you relate to and communicate with others. ? Family therapy. This treatment includes members of your family.  Medicine to treat anxiety and depression, or to help you control certain emotions and behaviors.  Lifestyle changes, such as: ? Limiting alcohol and drug use. ? Exercising regularly. ? Getting plenty of sleep. ? Making healthy eating choices. ? Spending more time outdoors.  Treatments involving stimulation of the brain can be used in situations with extremely severe symptoms, or when medicine or other therapies do not work over time. These treatments include electroconvulsive therapy, transcranial magnetic stimulation, and vagal nerve stimulation. Follow these instructions at home: Activity  Return to your normal activities as told by your health care provider.  Exercise regularly and spend time outdoors as told by your health care provider. General instructions  Take over-the-counter and prescription medicines only as told by your health care provider.  Do not drink alcohol. If you drink alcohol, limit your alcohol intake to no more than 1 drink a day for nonpregnant women and 2 drinks a day for men. One drink equals 12 oz of beer, 5 oz of wine, or 1 oz of hard liquor. Alcohol can affect any antidepressant medicines you are taking. Talk to your health care provider about your alcohol use.  Eat a healthy diet and get plenty of sleep.  Find activities that you enjoy doing, and make time to do them.  Consider joining a support group. Your health care provider may be able to recommend a support  group.  Keep all follow-up visits as told by your health care provider. This is important. Where to find more information [employer] Mental Illness  www.nami.org [employer] of Mental Health  http://www.maynard.net/www.nimh.nih.gov National Suicide Prevention Lifeline  1-800-273-TALK 212-317-8730(8255). This is free, 24-hour help. Contact a health care provider if:  Your symptoms get worse.  You develop new symptoms. Get help right away if:  You self-harm.  You have serious thoughts about hurting yourself or others.  You see, hear, taste, smell, or feel things that are not present (hallucinate). This information is not intended to replace advice given to you by your health care provider. Make sure you discuss any questions you have with your health care provider. Document Released: 08/09/2012 Document Revised: 03/27/2017 Document Reviewed: 10/24/2015 Elsevier Patient Education  2020 [employer]

## 2018-12-07 ENCOUNTER — Other Ambulatory Visit (HOSPITAL_COMMUNITY)
Admission: RE | Admit: 2018-12-07 | Discharge: 2018-12-07 | Disposition: A | Discharge status: Home / Self Care | Payer: BC Managed Care – PPO | Source: Ambulatory Visit | Attending: Family Medicine | Admitting: Family Medicine

## 2018-12-07 ENCOUNTER — Ambulatory Visit (INDEPENDENT_AMBULATORY_CARE_PROVIDER_SITE_OTHER): Payer: BC Managed Care – PPO | Admitting: Family Medicine

## 2018-12-07 ENCOUNTER — Encounter: Payer: Self-pay | Admitting: Family Medicine

## 2018-12-07 ENCOUNTER — Other Ambulatory Visit: Payer: Self-pay

## 2018-12-07 VITALS — BP 120/82 | HR 72 | Temp 98.5°F | Resp 16 | Ht 73.0 in | Wt 161.0 lb

## 2018-12-07 DIAGNOSIS — R300 Dysuria: Secondary | ICD-10-CM | POA: Diagnosis not present

## 2018-12-07 DIAGNOSIS — Z202 Contact with and (suspected) exposure to infections with a predominantly sexual mode of transmission: Secondary | ICD-10-CM | POA: Diagnosis not present

## 2018-12-07 DIAGNOSIS — R369 Urethral discharge, unspecified: Secondary | ICD-10-CM

## 2018-12-07 NOTE — Progress Notes (Signed)
   Subjective  CC:  Chief Complaint  Patient presents with  . STD testing    Reports some burning with urination    HPI: Travis Middleton is a 23 y.o. male who presents to the office today to address the problems listed above in the chief complaint.  23 yo with unprotected intercourse with new partner who is asymptomatic now with dysuria, penile dishcarge x 1. No rash, f/c/s. No h/o STDs. No malaise.   Assessment  1. Dysuria   2. Penile discharge      Plan   Will test for STDs given sxs. Discussed std prevention.   Follow up: Return for as scheduled.  12/30/2018  Orders Placed This Encounter  Procedures  . HIV Antibody (routine testing w rflx)  . RPR  . POCT urinalysis dipstick   No orders of the defined types were placed in this encounter.     I reviewed the patients updated PMH, FH, and SocHx.    Patient Active Problem List   Diagnosis Date Noted  . Major depression, recurrent, chronic ([place]) 11/18/2018  . Outbursts of anger 10/19/2018  . Mood and affect disturbance 10/19/2018  . Chronic headaches 10/04/2013  . Migraine 10/04/2013   Current Meds  Medication Sig  . FLUoxetine (PROZAC) 20 MG tablet Take 0.5 tablets (10 mg total) by mouth daily for 7 days, THEN 1 tablet (20 mg total) daily.  . nortriptyline (PAMELOR) 25 MG capsule Take by mouth.  . rizatriptan (MAXALT) 5 MG tablet     Allergies: Patient is allergic to azithromycin. Family History: Patient family history includes Cancer in his maternal grandmother; High blood pressure in his father; Other in an other family member. Social History:  Patient  reports that he has been smoking. He has never used smokeless tobacco. He reports current alcohol use of about 6.0 standard drinks of alcohol per week. He reports that he does not use drugs.  Review of Systems: Constitutional: Negative for fever malaise or anorexia Cardiovascular: negative for chest pain Respiratory: negative for SOB or persistent cough  Gastrointestinal: negative for abdominal pain  Objective  Vitals: BP 120/82   Pulse 72   Temp 98.5 F (36.9 C) (Tympanic)   Resp 16   Ht 6\' 1"  (1.854 m)   Wt 161 lb (73 kg)   SpO2 98%   BMI 21.24 kg/m  General: no acute distress , A&Ox3      Commons side effects, risks, benefits, and alternatives for medications and treatment plan prescribed today were discussed, and the patient expressed understanding of the given instructions. Patient is instructed to call or message via MyChart if he/she has any questions or concerns regarding our treatment plan. No barriers to understanding were identified. We discussed Red Flag symptoms and signs in detail. Patient expressed understanding regarding what to do in case of urgent or emergency type symptoms.   Medication list was reconciled, printed and provided to the patient in AVS. Patient instructions and summary information was reviewed with the patient as documented in the AVS. This note was prepared with assistance of Dragon voice recognition software. Occasional wrong-word or sound-a-like substitutions may have occurred due to the inherent limitations of voice recognition software

## 2018-12-07 NOTE — Patient Instructions (Signed)
Please follow up as scheduled for your next visit with me: 12/30/2018   If you have any questions or concerns, please don't hesitate to send me a message via MyChart or call the office at [phone]. Thank you for visiting with [place] today! It's our pleasure caring for you.   Safe Sex Practicing safe sex means taking steps before and during sex to reduce your risk of:  Getting an STI (sexually transmitted infection).  Giving your partner an STI.  Unwanted or unplanned pregnancy. How can I practice safe sex?     Ways you can practice safe sex  Limit your sexual partners to only one partner who is having sex with only you.  Avoid using alcohol and drugs before having sex. Alcohol and drugs can affect your judgment.  Before having sex with a new partner: ? Talk to your partner about past partners, past STIs, and drug use. ? Get screened for STIs and discuss the results with your partner. Ask your partner to get screened, too.  Check your body regularly for sores, blisters, rashes, or unusual discharge. If you notice any of these problems, visit your health care provider.  Avoid sexual contact if you have symptoms of an infection or you are being treated for an STI.  While having sex, use a condom. Make sure to: ? Use a condom every time you have vaginal, oral, or anal sex. Both females and males should wear condoms during oral sex. ? Keep condoms in place from the beginning to the end of sexual activity. ? Use a latex condom, if possible. Latex condoms offer the best protection. ? Use only water-based lubricants with a condom. Using petroleum-based lubricants or oils will weaken the condom and increase the chance that it will break. Ways your health care provider can help you practice safe sex  See your health care provider for regular screenings, exams, and tests for STIs.  Talk with your health care provider about what kind of birth control (contraception) is best for you.  Get  vaccinated against hepatitis B and human papillomavirus (HPV).  If you are at risk of being infected with HIV (human immunodeficiency virus), talk with your health care provider about taking a prescription medicine to prevent HIV infection. You are at risk for HIV if you: ? Are a man who has sex with other men. ? Are sexually active with more than one partner. ? Take drugs by injection. ? Have a sex partner who has HIV. ? Have unprotected sex. ? Have sex with someone who has sex with both men and women. ? Have had an STI. Follow these instructions at home:  Take over-the-counter and prescription medicines as told by your health care provider.  Keep all follow-up visits as told by your health care provider. This is important. Where to find more information  Centers for Disease Control and Prevention: PinkCheek.be  Planned Parenthood: https://www.plannedparenthood.org/  Office on Women's Health: BasketballVoice.it Summary  Practicing safe sex means taking steps before and during sex to reduce your risk of STIs, giving your partner STIs, and having an unwanted or unplanned pregnancy.  Before having sex with a new partner, talk to your partner about past partners, past STIs, and drug use.  Use a condom every time you have vaginal, oral, or anal sex. Both females and males should wear condoms during oral sex.  Check your body regularly for sores, blisters, rashes, or unusual discharge. If you notice any of these problems, visit your health care provider.  See your health care provider for regular screenings, exams, and tests for STIs. This information is not intended to replace advice given to you by your health care provider. Make sure you discuss any questions you have with your health care provider. Document Released: 05/22/2004 Document Revised: 08/06/2018 Document Reviewed: 01/25/2018 Elsevier  Patient Education  2020 [employer].

## 2018-12-08 LAB — POCT URINALYSIS DIPSTICK
Bilirubin, UA: NEGATIVE
Blood, UA: NEGATIVE
Glucose, UA: NEGATIVE
Ketones, UA: NEGATIVE
Leukocytes, UA: NEGATIVE
Nitrite, UA: NEGATIVE
Protein, UA: NEGATIVE
Spec Grav, UA: 1.015 (ref 1.010–1.025)
Urobilinogen, UA: 0.2 E.U./dL
pH, UA: 8 (ref 5.0–8.0)

## 2018-12-08 LAB — URINE CYTOLOGY ANCILLARY ONLY
Chlamydia: NEGATIVE
Neisseria Gonorrhea: NEGATIVE
Trichomonas: NEGATIVE

## 2018-12-08 LAB — RPR: RPR Ser Ql: NONREACTIVE

## 2018-12-08 LAB — HIV ANTIBODY (ROUTINE TESTING W REFLEX): HIV 1&2 Ab, 4th Generation: NONREACTIVE

## 2018-12-30 ENCOUNTER — Encounter: Payer: Self-pay | Admitting: Family Medicine

## 2018-12-30 ENCOUNTER — Ambulatory Visit (INDEPENDENT_AMBULATORY_CARE_PROVIDER_SITE_OTHER): Payer: BC Managed Care – PPO | Admitting: Family Medicine

## 2018-12-30 ENCOUNTER — Other Ambulatory Visit: Payer: Self-pay

## 2018-12-30 VITALS — BP 114/76 | HR 59 | Temp 98.3°F | Resp 16 | Ht 73.0 in | Wt 163.0 lb

## 2018-12-30 DIAGNOSIS — F339 Major depressive disorder, recurrent, unspecified: Secondary | ICD-10-CM | POA: Diagnosis not present

## 2018-12-30 DIAGNOSIS — R4586 Emotional lability: Secondary | ICD-10-CM

## 2018-12-30 MED ORDER — ESCITALOPRAM OXALATE 10 MG PO TABS: 10.0000 mg | ORAL_TABLET | Freq: Every day | ORAL | 2 refills | Status: DC

## 2018-12-30 NOTE — Patient Instructions (Signed)
Please return at your convenience for your annual complete physical; please come fasting.  Please call to get set up with a psychiatrist to better address your mood and anger issues. You can also set up counseling with their therapists as well.   If you have any questions or concerns, please don't hesitate to send me a message via MyChart or call the office at [phone]. Thank you for visiting with [place] today! It's our pleasure caring for you.  Psychiatrists  [address] [place], [place] [address] [phone]  [place], [place] [address] [place], [place] [address] [place], [place] [address] [phone]  [facility] Pauline Good, NP Magda Paganini NP [address] [place], [place] [address] [phone]  Counseling centers only:  [facility] [address] [place], [place] [facility]: [facility] [address] [place] [address] [place], [place] [phone]   [facility] [address] [place], [place] [address] [place], [place] [address] [place]  [facility] [address] [place], [place] [address] [phone]

## 2018-12-30 NOTE — Progress Notes (Signed)
Subjective  CC:  Chief Complaint  Patient presents with  . Depression    He reports that symptoms are about the same    HPI: Travis Middleton is a 23 y.o. male who presents to the office today to address the problems listed above in the chief complaint, mood problems.  See last notes. We started prozac about 6 weeks ago. Unfortunately pt remains very flat. Says NO change. No SI. No anxiety sxs. Still with anger issues and relationship issues. No triggers.  Depression screen [facility] 2/9 12/30/2018 10/19/2018  Decreased Interest 1 1  Down, Depressed, Hopeless 1 1  PHQ - 2 Score 2 2  Altered sleeping 1 1  Tired, decreased energy 2 2  Change in appetite 1 2  Feeling bad or failure about yourself  1 0  Trouble concentrating 3 2  Moving slowly or fidgety/restless 0 0  Suicidal thoughts 0 0  PHQ-9 Score 10 9  Difficult doing work/chores Very difficult Very difficult     Assessment  1. Major depression, recurrent, chronic (H[facility])   2. Mood and affect disturbance      Plan   Depression:  Flat, request help from psych to further evaluate, dx and treat. Stop prozac. Trial of lexapro. rec therapy as well. See avs.   Follow up: cpe   No orders of the defined types were placed in this encounter.  Meds ordered this encounter  Medications  . escitalopram (LEXAPRO) 10 MG tablet    Sig: Take 1 tablet (10 mg total) by mouth daily.    Dispense:  30 tablet    Refill:  2      I reviewed the patients updated PMH, FH, and SocHx.    Patient Active Problem List   Diagnosis Date Noted  . Major depression, recurrent, chronic (H[facility]) 11/18/2018  . Outbursts of anger 10/19/2018  . Mood and affect disturbance 10/19/2018  . Chronic headaches 10/04/2013  . Migraine 10/04/2013   Current Meds  Medication Sig  . nortriptyline (PAMELOR) 25 MG capsule Take by mouth.  . rizatriptan (MAXALT) 5 MG tablet   . [DISCONTINUED] FLUoxetine (PROZAC) 20 MG tablet Take 0.5 tablets (10 mg total) by mouth daily for 7  days, THEN 1 tablet (20 mg total) daily.    Allergies: Patient is allergic to azithromycin. Family history:  Patient family history includes Cancer in his maternal grandmother; High blood pressure in his father; Other in an other family member. Social History   Socioeconomic History  . Marital status: Single    Spouse name: Not on file  . Number of children: 0  . Years of education: 62  . Highest education level: Not on file  Occupational History  . Occupation: [employer]  Social Needs  . Financial resource strain: Not on file  . Food insecurity    Worry: Not on file    Inability: Not on file  . Transportation needs    Medical: Not on file    Non-medical: Not on file  Tobacco Use  . Smoking status: Current Every Day Smoker  . Smokeless tobacco: Never Used  Substance and Sexual Activity  . Alcohol use: Yes    Alcohol/week: 6.0 standard drinks    Types: 1 Glasses of wine, 1 Shots of liquor, 4 Standard drinks or equivalent per week    Comment: social events  . Drug use: No  . Sexual activity: Not on file  Lifestyle  . Physical activity    Days per week: Not on  file    Minutes per session: Not on file  . Stress: Not on file  Relationships  . Social Herbalist on phone: Not on file    Gets together: Not on file    Attends religious service: Not on file    Active member of club or organization: Not on file    Attends meetings of clubs or organizations: Not on file    Relationship status: Not on file  Other Topics Concern  . Not on file  Social History Narrative   Lives at home w/ his mom   Right-handed   Caffeine: occasional soda     Review of Systems: Constitutional: Negative for fever malaise or anorexia Cardiovascular: negative for chest pain Respiratory: negative for SOB or persistent cough Gastrointestinal: negative for abdominal pain  Objective  Vitals: BP 114/76   Pulse (!) 59   Temp 98.3 F (36.8 C) (Tympanic)   Resp 16   Ht 6\' 1"   (1.854 m)   Wt 163 lb (73.9 kg)   SpO2 98%   BMI 21.51 kg/m  General: no acute distress, well appearing, no apparent distress, well groomed Psych:  Alert and oriented x 3,flat affect. normal speech and cognition. hypokinetic.      Commons side effects, risks, benefits, and alternatives for medications and treatment plan prescribed today were discussed, and the patient expressed understanding of the given instructions. Patient is instructed to call or message via MyChart if he/she has any questions or concerns regarding our treatment plan. No barriers to understanding were identified. We discussed Red Flag symptoms and signs in detail. Patient expressed understanding regarding what to do in case of urgent or emergency type symptoms.   Medication list was reconciled, printed and provided to the patient in AVS. Patient instructions and summary information was reviewed with the patient as documented in the AVS. This note was prepared with assistance of Dragon voice recognition software. Occasional wrong-word or sound-a-like substitutions may have occurred due to the inherent limitations of voice recognition software

## 2019-01-26 ENCOUNTER — Encounter: Payer: Self-pay | Admitting: Family Medicine

## 2019-01-26 DIAGNOSIS — S60122A Contusion of left index finger with damage to nail, initial encounter: Secondary | ICD-10-CM | POA: Diagnosis not present

## 2019-01-26 DIAGNOSIS — S60941A Unspecified superficial injury of left index finger, initial encounter: Secondary | ICD-10-CM | POA: Diagnosis not present

## 2019-02-02 ENCOUNTER — Encounter: Payer: BC Managed Care – PPO | Admitting: Family Medicine

## 2019-12-26 ENCOUNTER — Other Ambulatory Visit (HOSPITAL_COMMUNITY)
Admission: RE | Admit: 2019-12-26 | Discharge: 2019-12-26 | Disposition: A | Discharge status: Home / Self Care | Payer: BC Managed Care – PPO | Source: Ambulatory Visit | Attending: Family Medicine | Admitting: Family Medicine

## 2019-12-26 ENCOUNTER — Ambulatory Visit (INDEPENDENT_AMBULATORY_CARE_PROVIDER_SITE_OTHER): Payer: BC Managed Care – PPO | Admitting: Family Medicine

## 2019-12-26 ENCOUNTER — Other Ambulatory Visit: Payer: Self-pay

## 2019-12-26 ENCOUNTER — Encounter: Payer: Self-pay | Admitting: Family Medicine

## 2019-12-26 VITALS — BP 118/75 | HR 84 | Temp 98.3°F | Resp 16 | Ht 73.0 in | Wt 196.0 lb

## 2019-12-26 DIAGNOSIS — R4586 Emotional lability: Secondary | ICD-10-CM

## 2019-12-26 DIAGNOSIS — Z1159 Encounter for screening for other viral diseases: Secondary | ICD-10-CM

## 2019-12-26 DIAGNOSIS — Z23 Encounter for immunization: Secondary | ICD-10-CM

## 2019-12-26 DIAGNOSIS — Z113 Encounter for screening for infections with a predominantly sexual mode of transmission: Secondary | ICD-10-CM | POA: Diagnosis not present

## 2019-12-26 DIAGNOSIS — G43009 Migraine without aura, not intractable, without status migrainosus: Secondary | ICD-10-CM

## 2019-12-26 DIAGNOSIS — Z0000 Encounter for general adult medical examination without abnormal findings: Secondary | ICD-10-CM | POA: Diagnosis not present

## 2019-12-26 NOTE — Progress Notes (Signed)
Subjective  Chief Complaint  Patient presents with  . Annual Exam    Pt has no concerns today.    HPI: Travis Middleton is a 24 y.o. male who presents to [facility] today for a Male Wellness Visit. He also has the concerns and/or needs as listed above in the chief complaint. These will be addressed in addition to the Health Maintenance Visit.   Wellness Visit: annual visit with health maintenance review and exam    Health maintenance: He had healthy male without concerns.  Requesting STD screening done.  No high risk exposures for symptoms.  Due Tdap.  Has been vaccinated against Covid, last vaccine was over 6 weeks ago. Lifestyle: Body mass index is 25.86 kg/m. Wt Readings from Last 3 Encounters:  12/26/19 196 lb (88.9 kg)  12/30/18 163 lb (73.9 kg)  12/07/18 161 lb (73 kg)     Chronic disease management visit and/or acute problem visit:  Mood disorder: No longer on medications.  Managing behaviorally.  Since he is doing okay.  Never saw psychiatry.  Patient Active Problem List   Diagnosis Date Noted  . Major depression, recurrent, chronic ([place]) 11/18/2018  . Outbursts of anger 10/19/2018  . Mood and affect disturbance 10/19/2018  . Chronic headaches 10/04/2013  . Migraine 10/04/2013   Health Maintenance  Topic Date Due  . Hepatitis C Screening  Never done  . COVID-19 Vaccine (1) Never done  . TETANUS/TDAP  12/10/2016  . INFLUENZA VACCINE  11/27/2019  . HIV Screening  Completed   Immunization History  Administered Date(s) Administered  . DTaP 08/28/1995, 10/23/1995, 12/23/1995, 11/28/1996, 09/16/2000  . Hepatitis A 12/11/2006, 06/23/2007  . Hepatitis B 06/27/1995, 07/20/1995, 03/22/1996  . HiB (PRP-OMP) 08/28/1995, 10/23/1995, 12/23/1995, 09/09/1996  . Hpv 02/16/2009, 04/18/2009, 08/28/2009  . IPV 08/28/1995, 10/23/1995, 11/28/1996, 09/16/2000  . MMR 09/09/1996, 09/16/2000  . Meningococcal Conjugate 12/11/2006  . Tdap 12/11/2006  .  Varicella 06/24/1996, 12/11/2006   We updated and reviewed the patient's past history in detail and it is documented below. Allergies: Patient is allergic to azithromycin. Past Medical History  has a past medical history of Migraine. Past Surgical History Patient  has a past surgical history that includes Closed reduction hand fracture (Right). Social History Patient  reports that he has been smoking. He has never used smokeless tobacco. He reports current alcohol use of about 6.0 standard drinks of alcohol per week. He reports that he does not use drugs. Family History family history includes Cancer in his maternal grandmother; High blood pressure in his father; Other in an other family member. Review of Systems: Constitutional: negative for fever or malaise Ophthalmic: negative for photophobia, double vision or loss of vision Cardiovascular: negative for chest pain, dyspnea on exertion, or new LE swelling Respiratory: negative for SOB or persistent cough Gastrointestinal: negative for abdominal pain, change in bowel habits or melena Genitourinary: negative for dysuria or gross hematuria Musculoskeletal: negative for new gait disturbance or muscular weakness Integumentary: negative for new or persistent rashes Neurological: negative for TIA or stroke symptoms Psychiatric: negative for SI or delusions Allergic/Immunologic: negative for hives  Patient Care Team    Relationship Specialty Notifications Start End  Leamon Arnt, MD PCP - General Family Medicine  10/19/18    Objective  Vitals: BP 118/75   Pulse 84   Temp 98.3 F (36.8 C) (Temporal)   Resp 16   Ht 6' 1" (1.854 m)   Wt 196 lb (88.9 kg)  SpO2 99%   BMI 25.86 kg/m  General:  Well developed, well nourished, no acute distress  Psych:  Alert and orientedx3,normal mood and affect HEENT:  Normocephalic, atraumatic, non-icteric sclera, PERRL, oropharynx is clear without mass or exudate, supple neck without adenopathy,  mass or thyromegaly Cardiovascular:  Normal S1, S2, RRR without gallop, rub or murmur, nondisplaced PMI, +2 distal pulses in bilateral upper and lower extremities. Respiratory:  Good breath sounds bilaterally, CTAB with normal respiratory effort Gastrointestinal: normal bowel sounds, soft, non-tender, no noted masses. No HSM MSK: no deformities, contusions. Joints are without erythema or swelling. Spine and CVA region are nontender Skin:  Warm, no rashes or suspicious lesions noted Neurologic:    Mental status is normal. CN 2-11 are normal. Gross motor and sensory exams are normal. Stable gait. No tremor GU: No inguinal hernias or adenopathy are appreciated bilaterally   Assessment  1. Annual physical exam   2. Migraine without aura and without status migrainosus, not intractable   3. Mood and affect disturbance   4. Screen for STD (sexually transmitted disease)   5. Need for hepatitis C screening test      Plan  Male Wellness Visit:  Age appropriate Health Maintenance and Prevention measures were discussed with patient. Included topics are cancer screening recommendations, ways to keep healthy (see AVS) including dietary and exercise recommendations, regular eye and dental care, use of seat belts, and avoidance of moderate alcohol use and tobacco use.   BMI: discussed patient's BMI and encouraged positive lifestyle modifications to help get to or maintain a target BMI.  HM needs and immunizations were addressed and ordered. See below for orders. See HM and immunization section for updates.  Tdap given today  Routine labs and screening tests ordered including cmp, cbc and lipids where appropriate.  Discussed recommendations regarding Vit D and calcium supplementation (see AVS)  Chronic disease f/u and/or acute problem visit: (deemed necessary to be done in addition to the wellness visit):  Screen for STDs  Migraines are stable  Monitor for worsening mood.  Follow up: Return in  about 1 year (around 12/25/2020) for complete physical.   Commons side effects, risks, benefits, and alternatives for medications and treatment plan prescribed today were discussed, and the patient expressed understanding of the given instructions. Patient is instructed to call or message via MyChart if he/she has any questions or concerns regarding our treatment plan. No barriers to understanding were identified. We discussed Red Flag symptoms and signs in detail. Patient expressed understanding regarding what to do in case of urgent or emergency type symptoms.   Medication list was reconciled, printed and provided to the patient in AVS. Patient instructions and summary information was reviewed with the patient as documented in the AVS. This note was prepared with assistance of Dragon voice recognition software. Occasional wrong-word or sound-a-like substitutions may have occurred due to the inherent limitations of voice recognition software  This visit occurred during the SARS-CoV-2 public health emergency.  Safety protocols were in place, including screening questions prior to the visit, additional usage of staff PPE, and extensive cleaning of exam room while observing appropriate contact time as indicated for disinfecting solutions.   Orders Placed This Encounter  Procedures  . CBC with Differential/Platelet  . Comprehensive metabolic panel  . RPR  . Hepatitis C antibody  . Hepatitis B surface antigen  . HIV Antibody (routine testing w rflx)   No orders of the defined types were placed in this encounter.

## 2019-12-26 NOTE — Patient Instructions (Signed)
Please return in 12 months for your annual complete physical; please come fasting.   I will release your lab results to you on your MyChart account with further instructions. Please reply with any questions.    If you have any questions or concerns, please don't hesitate to send me a message via MyChart or call the office at [phone]. Thank you for visiting with us today! It's our pleasure caring for you.   Please do these things to maintain good health!  Exercise at least 30-45 minutes a day,  4-5 days a week.  Eat a low-fat diet with lots of fruits and vegetables, up to 7-9 servings per day. Drink plenty of water daily. Try to drink 8 8oz glasses per day. Seatbelts can save your life. Always wear your seatbelt. Place Smoke Detectors on every level of your home and check batteries every year. Eye Doctor - have an eye exam every 1-2 years Safe sex - use condoms to protect yourself from STDs if you could be exposed to these types of infections. Avoid heavy alcohol use. If you drink, keep it to less than 2 drinks/day and not every day. Health Care Power of Attorney.  Choose someone you trust that could speak for you if you became unable to speak for yourself. Depression is common in our stressful world.If you're feeling down or losing interest in things you normally enjoy, please come in for a visit.  

## 2019-12-26 NOTE — Addendum Note (Signed)
Addended by: Manuela Schwartz on: 12/26/2019 03:12 PM   Modules accepted: Orders

## 2019-12-27 ENCOUNTER — Encounter: Payer: Self-pay | Admitting: Family Medicine

## 2019-12-27 LAB — HEPATITIS C ANTIBODY
Hepatitis C Ab: NONREACTIVE
SIGNAL TO CUT-OFF: 0.03 (ref <1.00)

## 2019-12-27 LAB — CBC WITH DIFFERENTIAL/PLATELET
Absolute Monocytes: 555 cells/uL (ref 200–950)
Basophils Absolute: 52 cells/uL (ref 0–200)
Basophils Relative: 0.7 %
Eosinophils Absolute: 30 cells/uL (ref 15–500)
Eosinophils Relative: 0.4 %
HCT: 45.8 % (ref 38.5–50.0)
Hemoglobin: 14.8 g/dL (ref 13.2–17.1)
Lymphs Abs: 1724 cells/uL (ref 850–3900)
MCH: 27.3 pg (ref 27.0–33.0)
MCHC: 32.3 g/dL (ref 32.0–36.0)
MCV: 84.3 fL (ref 80.0–100.0)
MPV: 11.6 fL (ref 7.5–12.5)
Monocytes Relative: 7.5 %
Neutro Abs: 5039 cells/uL (ref 1500–7800)
Neutrophils Relative %: 68.1 %
Platelets: 253 10*3/uL (ref 140–400)
RBC: 5.43 10*6/uL (ref 4.20–5.80)
RDW: 12.9 % (ref 11.0–15.0)
Total Lymphocyte: 23.3 %
WBC: 7.4 10*3/uL (ref 3.8–10.8)

## 2019-12-27 LAB — COMPREHENSIVE METABOLIC PANEL
AG Ratio: 1.8 (calc) (ref 1.0–2.5)
ALT: 20 U/L (ref 9–46)
AST: 15 U/L (ref 10–40)
Albumin: 4.7 g/dL (ref 3.6–5.1)
Alkaline phosphatase (APISO): 58 U/L (ref 36–130)
BUN: 11 mg/dL (ref 7–25)
CO2: 26 mmol/L (ref 20–32)
Calcium: 9.8 mg/dL (ref 8.6–10.3)
Chloride: 104 mmol/L (ref 98–110)
Creat: 1.03 mg/dL (ref 0.60–1.35)
Globulin: 2.6 g/dL (calc) (ref 1.9–3.7)
Glucose, Bld: 90 mg/dL (ref 65–99)
Potassium: 3.9 mmol/L (ref 3.5–5.3)
Sodium: 141 mmol/L (ref 135–146)
Total Bilirubin: 0.9 mg/dL (ref 0.2–1.2)
Total Protein: 7.3 g/dL (ref 6.1–8.1)

## 2019-12-27 LAB — HEPATITIS B SURFACE ANTIGEN: Hepatitis B Surface Ag: NONREACTIVE

## 2019-12-27 LAB — RPR: RPR Ser Ql: NONREACTIVE

## 2019-12-27 LAB — HIV ANTIBODY (ROUTINE TESTING W REFLEX): HIV 1&2 Ab, 4th Generation: NONREACTIVE

## 2019-12-28 LAB — URINE CYTOLOGY ANCILLARY ONLY
Chlamydia: NEGATIVE
Comment: NEGATIVE
Comment: NEGATIVE
Comment: NORMAL
Neisseria Gonorrhea: NEGATIVE
Trichomonas: NEGATIVE

## 2020-05-28 DIAGNOSIS — U071 COVID-19: Secondary | ICD-10-CM | POA: Diagnosis not present

## 2020-06-08 DIAGNOSIS — X58XXXA Exposure to other specified factors, initial encounter: Secondary | ICD-10-CM | POA: Diagnosis not present

## 2020-06-08 DIAGNOSIS — S62154A Nondisplaced fracture of hook process of hamate [unciform] bone, right wrist, initial encounter for closed fracture: Secondary | ICD-10-CM | POA: Diagnosis not present

## 2020-06-08 DIAGNOSIS — M25541 Pain in joints of right hand: Secondary | ICD-10-CM | POA: Diagnosis not present

## 2020-06-13 DIAGNOSIS — S62154D Nondisplaced fracture of hook process of hamate [unciform] bone, right wrist, subsequent encounter for fracture with routine healing: Secondary | ICD-10-CM | POA: Diagnosis not present

## 2020-06-13 DIAGNOSIS — S62154A Nondisplaced fracture of hook process of hamate [unciform] bone, right wrist, initial encounter for closed fracture: Secondary | ICD-10-CM | POA: Diagnosis not present

## 2020-06-13 DIAGNOSIS — S62306D Unspecified fracture of fifth metacarpal bone, right hand, subsequent encounter for fracture with routine healing: Secondary | ICD-10-CM | POA: Diagnosis not present

## 2020-06-13 DIAGNOSIS — W228XXD Striking against or struck by other objects, subsequent encounter: Secondary | ICD-10-CM | POA: Diagnosis not present

## 2020-12-26 ENCOUNTER — Encounter: Payer: BC Managed Care – PPO | Admitting: Family Medicine

## 2022-01-20 ENCOUNTER — Encounter: Payer: Self-pay | Admitting: *Deleted

## 2022-04-10 ENCOUNTER — Encounter: Payer: Self-pay | Admitting: *Deleted
